# Patient Record
Sex: Female | Born: 1960 | Race: White | Hispanic: No | Marital: Married | Smoking: Never smoker
Health system: Southern US, Community
[De-identification: ages and names within clinical notes are randomized; demographics above are authoritative.]

## PROBLEM LIST (undated history)

## (undated) DIAGNOSIS — F410 Panic disorder [episodic paroxysmal anxiety] without agoraphobia: Secondary | ICD-10-CM

## (undated) DIAGNOSIS — R51 Headache: Secondary | ICD-10-CM

## (undated) DIAGNOSIS — K7689 Other specified diseases of liver: Secondary | ICD-10-CM

## (undated) DIAGNOSIS — G8929 Other chronic pain: Secondary | ICD-10-CM

## (undated) DIAGNOSIS — F419 Anxiety disorder, unspecified: Secondary | ICD-10-CM

## (undated) HISTORY — DX: Other chronic pain: G89.29

## (undated) HISTORY — DX: Panic disorder (episodic paroxysmal anxiety): F41.0

## (undated) HISTORY — DX: Anxiety disorder, unspecified: F41.9

## (undated) HISTORY — DX: Other specified diseases of liver: K76.89

## (undated) HISTORY — DX: Headache: R51

---

## 2014-10-15 ENCOUNTER — Encounter: Payer: Self-pay | Admitting: Internal Medicine

## 2014-11-25 ENCOUNTER — Encounter: Payer: Self-pay | Admitting: *Deleted

## 2014-12-24 ENCOUNTER — Encounter: Payer: Self-pay | Admitting: Internal Medicine

## 2014-12-24 ENCOUNTER — Ambulatory Visit (INDEPENDENT_AMBULATORY_CARE_PROVIDER_SITE_OTHER): Payer: BLUE CROSS/BLUE SHIELD | Admitting: Internal Medicine

## 2014-12-24 ENCOUNTER — Other Ambulatory Visit (INDEPENDENT_AMBULATORY_CARE_PROVIDER_SITE_OTHER): Payer: BLUE CROSS/BLUE SHIELD

## 2014-12-24 VITALS — BP 100/60 | HR 72 | Ht 63.5 in | Wt 106.0 lb

## 2014-12-24 DIAGNOSIS — K7689 Other specified diseases of liver: Secondary | ICD-10-CM | POA: Diagnosis not present

## 2014-12-24 DIAGNOSIS — R748 Abnormal levels of other serum enzymes: Secondary | ICD-10-CM | POA: Diagnosis not present

## 2014-12-24 LAB — FERRITIN: FERRITIN: 38.7 ng/mL (ref 10.0–291.0)

## 2014-12-24 LAB — TSH: TSH: 2.57 u[IU]/mL (ref 0.35–4.50)

## 2014-12-24 LAB — CBC WITH DIFFERENTIAL/PLATELET
Basophils Absolute: 0 10*3/uL (ref 0.0–0.1)
Basophils Relative: 0.8 % (ref 0.0–3.0)
EOS PCT: 2.4 % (ref 0.0–5.0)
Eosinophils Absolute: 0.1 10*3/uL (ref 0.0–0.7)
HEMATOCRIT: 37.1 % (ref 36.0–46.0)
HEMOGLOBIN: 12.6 g/dL (ref 12.0–15.0)
LYMPHS ABS: 1.6 10*3/uL (ref 0.7–4.0)
Lymphocytes Relative: 38.1 % (ref 12.0–46.0)
MCHC: 33.9 g/dL (ref 30.0–36.0)
MCV: 87.4 fl (ref 78.0–100.0)
MONOS PCT: 12.7 % — AB (ref 3.0–12.0)
Monocytes Absolute: 0.5 10*3/uL (ref 0.1–1.0)
NEUTROS ABS: 1.9 10*3/uL (ref 1.4–7.7)
Neutrophils Relative %: 46 % (ref 43.0–77.0)
Platelets: 180 10*3/uL (ref 150.0–400.0)
RBC: 4.25 Mil/uL (ref 3.87–5.11)
RDW: 13.4 % (ref 11.5–15.5)
WBC: 4.1 10*3/uL (ref 4.0–10.5)

## 2014-12-24 LAB — IBC PANEL
Iron: 72 ug/dL (ref 42–145)
Saturation Ratios: 17.4 % — ABNORMAL LOW (ref 20.0–50.0)
TRANSFERRIN: 296 mg/dL (ref 212.0–360.0)

## 2014-12-24 LAB — BASIC METABOLIC PANEL
BUN: 24 mg/dL — ABNORMAL HIGH (ref 6–23)
CALCIUM: 9.2 mg/dL (ref 8.4–10.5)
CHLORIDE: 107 meq/L (ref 96–112)
CO2: 29 mEq/L (ref 19–32)
Creatinine, Ser: 0.81 mg/dL (ref 0.40–1.20)
GFR: 77.79 mL/min (ref >60.00)
Glucose, Bld: 92 mg/dL (ref 70–99)
Potassium: 4.1 mEq/L (ref 3.5–5.1)
Sodium: 140 mEq/L (ref 135–145)

## 2014-12-24 LAB — IGA: IgA: 128 mg/dL (ref 68–378)

## 2014-12-24 LAB — HEPATIC FUNCTION PANEL
ALT: 93 U/L — ABNORMAL HIGH (ref 0–35)
AST: 67 U/L — AB (ref 0–37)
Albumin: 4.3 g/dL (ref 3.5–5.2)
Alkaline Phosphatase: 58 U/L (ref 39–117)
BILIRUBIN TOTAL: 0.5 mg/dL (ref 0.2–1.2)
Bilirubin, Direct: 0.1 mg/dL (ref 0.0–0.3)
Total Protein: 6.7 g/dL (ref 6.0–8.3)

## 2014-12-24 LAB — GAMMA GT: GGT: 32 U/L (ref 7–51)

## 2014-12-24 NOTE — Patient Instructions (Signed)
   Your physician has requested that you go to the basement for the lab work before leaving today.  You have been scheduled for an MRI at [facility]  on 12/30/14. Your appointment time is 7:00pm. Please arrive 30 minutes prior to your appointment time for registration purposes. Please make certain not to have anything to eat or drink 6 hours prior to your test. In addition, if you have any metal in your body, have a pacemaker or defibrillator, please be sure to let your ordering physician know. This test typically takes 45 minutes to 1 hour to complete.  Take your Ativan with you but don't take it until the tech tells you.   I appreciate the opportunity to care for you.

## 2014-12-24 NOTE — Progress Notes (Signed)
Patient ID: Brandi Miranda, female   DOB: [birth year], 54 y.o.   MRN: [id number] HPI: Brandi Miranda is a 54 year old female with past medical history of elevated liver enzymes, history of liver cyst, headaches, and anxiety who seen in consultation at the request of Dr. Jonelle Sports to evaluate elevated liver enzymes. She's here today with her husband. They live in [place], [place]. She reports overall she feels well. She reports a good energy level. 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 She does alter sounds. She denies a change in appetite though she has never been a "big eater". No nausea or vomiting. No heartburn. No dysphagia or odynophagia. No abdominal pain. She reports normal bowel movements without constipation, diarrhea, blood in her stool or melena. She denies a history of jaundice, ascites, lower extremity edema. She does report itching which seems to come and go but she feels this is related to season and the use of lotion. She denies fever, chills and night sweats. She reports I always "stay cold". No prior surgeries other than a C-section. She no longer has menstrual periods.  She drinks alcohol infrequently less than daily and approximately 2-3 glasses of wine per week. She denies the use of over-the-counter medications. She takes aspirin may be one day per week. She is on Keppra for migraines. She takes Zyrtec 10 mg daily. She uses trazodone 50 mg at night for sleep. Very very rare Ativan use for anxiety.  Regarding her liver cyst. 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  She denies a family history of liver disease.  She had a colonoscopy which was reportedly normal with Dr. Aleene Davidson a few yrs ago  Past Medical History  Diagnosis Date  . Liver cyst   . Panic attack   . Anxiety   . Chronic headaches     On left side    Past  Surgical History  Procedure Laterality Date  . Cesarean section      No outpatient prescriptions prior to visit.   No facility-administered medications prior to visit.    No Known Allergies  Family History  Problem Relation Age of Onset  . Colon cancer Neg Hx   . Colon polyps Neg Hx   . Diabetes Mother   . Heart disease Mother   . Esophageal cancer Neg Hx   . Kidney disease Neg Hx   . Gallbladder disease Neg Hx     History  Substance Use Topics  . Smoking status: Never Smoker   . Smokeless tobacco: Never Used  . Alcohol Use: 0.0 oz/week    0 Standard drinks or equivalent per week     Comment: 2-3 glasses of wine a week    ROS: As per history of present illness, otherwise negative  BP 100/60 mmHg  Pulse 72  Ht 5' 3.5" (1.613 m)  Wt 106 lb (48.081 kg)  BMI 18.48 kg/m2 Constitutional: Well-developed and well-nourished. No distress. HEENT: Normocephalic and atraumatic. Oropharynx is clear and moist. No oropharyngeal exudate. Conjunctivae are normal.  No scleral icterus. Neck: Neck supple. Trachea midline. Cardiovascular: Normal rate, regular rhythm and intact distal pulses. No M/R/G Pulmonary/chest: Effort normal and breath sounds normal. No wheezing, rales or rhonchi. Abdominal: Soft, thin, nontender, nondistended. Bowel sounds active throughout. There are no masses palpable. No hepatosplenomegaly. Extremities: no clubbing, cyanosis, or edema Lymphadenopathy: No cervical  adenopathy noted. Neurological: Alert and oriented to person place and time. Skin: Skin is warm and dry. No rashes noted. Psychiatric: Normal mood and affect. Behavior is normal.  RELEVANT LABS AND IMAGING: CBC    Component Value Date/Time   WBC 4.1 12/24/2014 0947   RBC 4.25 12/24/2014 0947   HGB 12.6 12/24/2014 0947   HCT 37.1 12/24/2014 0947   PLT 180.0 12/24/2014 0947   MCV 87.4 12/24/2014 0947   MCHC 33.9 12/24/2014 0947   RDW 13.4 12/24/2014 0947   LYMPHSABS 1.6 12/24/2014 0947    MONOABS 0.5 12/24/2014 0947   EOSABS 0.1 12/24/2014 0947   BASOSABS 0.0 12/24/2014 0947    CMP     Component Value Date/Time   NA 140 12/24/2014 0947   K 4.1 12/24/2014 0947   CL 107 12/24/2014 0947   CO2 29 12/24/2014 0947   GLUCOSE 92 12/24/2014 0947   BUN 24* 12/24/2014 0947   CREATININE 0.81 12/24/2014 0947   CALCIUM 9.2 12/24/2014 0947   PROT 6.7 12/24/2014 0947   ALBUMIN 4.3 12/24/2014 0947   AST 67* 12/24/2014 0947   ALT 93* 12/24/2014 0947   ALKPHOS 58 12/24/2014 0947   BILITOT 0.5 12/24/2014 0947   Labs reviewed dated February 2016 -- AST 73, ALT 105, total bili 0.6, alkaline phosphatase 51, hemoglobin 12.5, platelet 200, hepatitis B surface antigen negative, core antibody total nonreactive, hep C antibody negative Labs dated September 2014 -- AST 226, ALT 376, total bili 0.7, alkaline phosphatase 60  Colonoscopy 08/29/2012 -- normal colonoscopy. Repeat in 10 years recommended,  exam to the cecum.  Abdominal ultrasound 03/19/2013 -- liver normal in size and echogenicity. Hypoechoic lesion in anterior aspect of the right lobe of the liver measuring 2.3 x 1.7 cm. The lesion has well-defined walls, no solid elements in good through transmission. No abnormal color flow. Normal portal venous flow to the liver itself. Gallbladder normal. Bile ducts normal with CBD less than 2 mm.   ASSESSMENT/PLAN: 54 year old female with past medical history of elevated liver enzymes, history of liver cyst, headaches, and anxiety who seen in consultation at the request of Dr. Jonelle Sports to evaluate elevated liver enzymes  1. Elevated serum transaminases -- persistent elevation in serum AST and ALT at least over the last 2 years. No evidence for chronic/decompensated liver disease or cirrhosis. We discussed the differential for elevated liver enzymes. I would like to determine etiology so that treatment can be started if necessary. We discussed how chronic liver inflammation can eventually lead to  scarring and even cirrhosis. Viral hepatitis studies negative this year. I recommended further lab testing to exclude autoimmune hepatitis and other auto immune liver conditions. Also will check iron studies, ceruloplasmin, alpha-1 antitrypsin, celiac panel and thyroid profile. Drug-induced liver inflammation was discussed though at this time there are no major offenders on her medication list. We discussed the possibility of biopsy if necessary but at this time we'll start with further lab testing. We'll also recommend cross-sectional imaging, see #2   2. Liver cyst -- likely benign with low risk features. Evaluate further with MRI abdomen, hepatic protocol  3. Colorectal cancer screening -- up-to-date, repeat colonoscopy recommended September 2024    Cc: Dr. Reuel Boom Pomposini

## 2014-12-25 ENCOUNTER — Telehealth: Payer: Self-pay | Admitting: Internal Medicine

## 2014-12-25 LAB — TISSUE TRANSGLUTAMINASE, IGA: Tissue Transglutaminase Ab, IgA: 1 U/mL (ref <4)

## 2014-12-25 LAB — GLIADIN ANTIBODIES, SERUM
Gliadin IgA: 4 Units (ref <20)
Gliadin IgG: 2 Units (ref <20)

## 2014-12-25 LAB — ANA: ANA: NEGATIVE

## 2014-12-25 NOTE — Telephone Encounter (Signed)
Had faxed order yesterday to her.  She thinks someone threw it away.  Re-faxed PT/INR order today.

## 2014-12-26 LAB — MITOCHONDRIAL ANTIBODIES: Mitochondrial M2 Ab, IgG: 0.3 (ref <0.91)

## 2014-12-26 LAB — RETICULIN ANTIBODIES, IGA W TITER: Reticulin Ab, IgA: NEGATIVE

## 2014-12-26 LAB — CERULOPLASMIN: Ceruloplasmin: 27 mg/dL (ref 18–53)

## 2014-12-26 LAB — ALPHA-1-ANTITRYPSIN: A1 ANTITRYPSIN SER: 166 mg/dL (ref 83–199)

## 2014-12-26 LAB — ANTI-SMOOTH MUSCLE ANTIBODY, IGG: Smooth Muscle Ab: 10 U (ref <20)

## 2014-12-30 ENCOUNTER — Ambulatory Visit (HOSPITAL_COMMUNITY)
Admission: RE | Admit: 2014-12-30 | Discharge: 2014-12-30 | Disposition: A | Discharge status: Home / Self Care | Payer: BLUE CROSS/BLUE SHIELD | Source: Ambulatory Visit | Attending: Internal Medicine | Admitting: Internal Medicine

## 2014-12-30 DIAGNOSIS — N281 Cyst of kidney, acquired: Secondary | ICD-10-CM | POA: Insufficient documentation

## 2014-12-30 DIAGNOSIS — K7689 Other specified diseases of liver: Secondary | ICD-10-CM | POA: Diagnosis not present

## 2014-12-30 DIAGNOSIS — R748 Abnormal levels of other serum enzymes: Secondary | ICD-10-CM

## 2014-12-30 MED ORDER — GADOBENATE DIMEGLUMINE 529 MG/ML IV SOLN
10.0000 mL | Freq: Once | INTRAVENOUS | Status: AC | PRN
  Administered 2014-12-30: 10 mL via INTRAVENOUS

## 2015-01-01 ENCOUNTER — Telehealth: Payer: Self-pay | Admitting: Internal Medicine

## 2015-01-01 ENCOUNTER — Other Ambulatory Visit: Payer: Self-pay

## 2015-01-01 DIAGNOSIS — R945 Abnormal results of liver function studies: Principal | ICD-10-CM

## 2015-01-01 DIAGNOSIS — R7989 Other specified abnormal findings of blood chemistry: Secondary | ICD-10-CM

## 2015-01-01 MED ORDER — TEMAZEPAM 15 MG PO CAPS: 15.0000 mg | ORAL_CAPSULE | Freq: Every evening | ORAL | Status: AC | PRN

## 2015-01-01 NOTE — Telephone Encounter (Signed)
See result note.  

## 2015-03-23 ENCOUNTER — Telehealth: Payer: Self-pay

## 2015-03-23 NOTE — Telephone Encounter (Signed)
-----   Message from Chrystie Nose, RN sent at 01/01/2015  8:39 AM EDT ----- Regarding: Labs Lfts in Sept. Order in

## 2015-03-23 NOTE — Telephone Encounter (Signed)
Left message for pt to call back  °

## 2015-03-24 NOTE — Telephone Encounter (Signed)
Unable to reach pt by phone. Letter mailed to pt regarding having labs drawn.

## 2015-05-15 ENCOUNTER — Telehealth: Payer: Self-pay | Admitting: Internal Medicine

## 2015-05-15 NOTE — Telephone Encounter (Signed)
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 She will call us on Monday with the fax number to send the order for LFT. She will fax us the results when she has it done.

## 2015-05-18 NOTE — Telephone Encounter (Signed)
Fax: [phone] Attn: Elnita Maxwellheryl

## 2015-05-18 NOTE — Telephone Encounter (Signed)
Faxed LFT order to fax number that was provided. Also placed our fax information on the cover sheet.

## 2015-05-19 ENCOUNTER — Telehealth: Payer: Self-pay | Admitting: Internal Medicine

## 2015-05-19 NOTE — Telephone Encounter (Signed)
Order refaxed per pt request.

## 2015-05-19 NOTE — Telephone Encounter (Signed)
Order faxed.

## 2015-05-25 ENCOUNTER — Telehealth: Payer: Self-pay | Admitting: Internal Medicine

## 2015-05-25 NOTE — Telephone Encounter (Signed)
I have not seen any labs. I looked in Dr Lauro FranklinPyrtle's office as well and they are not in there.

## 2015-05-25 NOTE — Telephone Encounter (Signed)
Have you seen any labs that were supposedly faxed on this pt?

## 2015-05-25 NOTE — Telephone Encounter (Signed)
Discussed with pt that we have not seen the labs. States she will refax them to [phone].

## 2015-08-24 ENCOUNTER — Telehealth: Payer: Self-pay | Admitting: *Deleted

## 2015-08-24 DIAGNOSIS — R945 Abnormal results of liver function studies: Principal | ICD-10-CM

## 2015-08-24 DIAGNOSIS — R7989 Other specified abnormal findings of blood chemistry: Secondary | ICD-10-CM

## 2015-08-24 NOTE — Telephone Encounter (Signed)
-----   Message from Richardson Chiquito, [place] sent at 08/17/2015  9:35 AM EST -----   ----- Message -----    From: Richardson Chiquito, CMA    Sent: 08/17/2015      To: Richardson Chiquito, CMA  Needs lfts around 2-29-17... Send orders to pt work (see previous telephone notes).

## 2015-08-24 NOTE — Telephone Encounter (Signed)
I have left a voicemail for patient to call back. Orders have been created and will be faxed to patient's work when she gives the go ahead.

## 2015-08-26 NOTE — Telephone Encounter (Signed)
I have spoken to patient and have faxed orders to her work per her request.

## 2015-09-14 NOTE — Telephone Encounter (Signed)
Left message for patient to call back  

## 2015-09-16 ENCOUNTER — Telehealth: Payer: Self-pay | Admitting: *Deleted

## 2015-09-16 NOTE — Telephone Encounter (Signed)
I have left another message for patient to call back. 

## 2015-09-16 NOTE — Telephone Encounter (Signed)
-----   Message from Richardson Chiquitoorothy N Kvon Mcilhenny, CMA sent at 09/14/2015  8:45 AM EDT ----- Left message for patient to call back. Did she?  ----- Message -----    From: Richardson Chiquitoorothy N Marston Mccadden, CMA    Sent: 09/11/2015      To: Richardson Chiquitoorothy N Kamarian Sahakian, CMA  Did patient have lft's at her work? See 2/29/17 note

## 2015-09-18 NOTE — Telephone Encounter (Signed)
Patient's labs have been sent over for Dr Pyrtle's review.

## 2015-09-21 ENCOUNTER — Telehealth: Payer: Self-pay | Admitting: Internal Medicine

## 2015-09-21 NOTE — Telephone Encounter (Signed)
Labs have been received and placed on Dr Lauro FranklinPyrtle's desk for review.

## 2015-09-24 NOTE — Telephone Encounter (Signed)
Dr. Rhea BeltonPyrtle has reviewed the labs and states LFT"s have normalized off trazodone. I have added Trazodone to allergy list for patient and per Dr. Rhea BeltonPyrtle patient needs repeat LFT's in 6 months. Reminder for patient put in our system. Left a message for patient to return my call to discuss.

## 2015-09-25 NOTE — Telephone Encounter (Signed)
Left a message for patient to return my call. 

## 2015-09-28 NOTE — Telephone Encounter (Signed)
Informed patient of results and they we will contact her in 6 months to remind her to repeat her LFT's. Patient verbalized understanding.

## 2016-03-28 ENCOUNTER — Telehealth: Payer: Self-pay | Admitting: *Deleted

## 2016-03-28 NOTE — Telephone Encounter (Signed)
I have left a voicemail for patient to call back. 

## 2016-03-28 NOTE — Telephone Encounter (Signed)
-----   Message from Jessee AversAmanda L Lewellyn, CMA sent at 09/24/2015 11:31 AM EDT ----- Per Dr. Rhea BeltonPyrtle from phone note on 09/21/15, she needs repeat LFT's 6 months from labs drawn [facility] 09/[facility]/15 [facility]

## 2016-03-29 NOTE — Telephone Encounter (Signed)
Left message with female for patient to call back. 

## 2016-03-30 NOTE — Telephone Encounter (Signed)
Patient returning your call can call back to [phone] Ask for her at work- [employer] [facility].

## 2016-03-30 NOTE — Telephone Encounter (Signed)
We have received lab results from patient. These have been placed on Dr Lauro FranklinPyrtle's desk for review.

## 2016-04-11 ENCOUNTER — Telehealth: Payer: Self-pay | Admitting: Internal Medicine

## 2016-04-11 NOTE — Telephone Encounter (Signed)
Patient advised that per Dr Rhea BeltonPyrtle, her most recent labs suffice for his requested follow up and that she can follow up with her PCP and see us as needed. She verbalizes understanding.

## 2023-03-21 ENCOUNTER — Encounter: Payer: Self-pay | Admitting: Internal Medicine

## 2023-05-12 ENCOUNTER — Ambulatory Visit (AMBULATORY_SURGERY_CENTER): Payer: BC Managed Care – PPO

## 2023-05-12 VITALS — Ht 64.0 in | Wt 113.0 lb

## 2023-05-12 DIAGNOSIS — Z1211 Encounter for screening for malignant neoplasm of colon: Secondary | ICD-10-CM

## 2023-05-12 MED ORDER — NA SULFATE-K SULFATE-MG SULF 17.5-3.13-1.6 GM/177ML PO SOLN: 1.0000 | Freq: Once | ORAL | 0 refills | Status: AC

## 2023-05-12 NOTE — Progress Notes (Signed)
No egg or soy allergy known to patient  No issues known to pt with past sedation with any surgeries or procedures Patient denies ever being told they had issues or difficulty with intubation  No FH of Malignant Hyperthermia Pt is not on diet pills Pt is not on  home 02  Pt is not on blood thinners  Pt reports constipation uses OTC stool softner as needed  No A fib or A flutter Have any cardiac testing pending--no  LOA: independent  Prep: suprep   Patient's chart reviewed by Cathlyn Parsons CNRA prior to previsit and patient appropriate for the [facility].  Previsit completed and red dot placed by patient's name on their procedure day (on provider's schedule).     PV competed with patient. Prep instructions sent via mychart and home address.

## 2023-05-30 ENCOUNTER — Encounter: Payer: BLUE CROSS/BLUE SHIELD | Admitting: Internal Medicine

## 2023-07-25 ENCOUNTER — Encounter: Payer: Self-pay | Admitting: Internal Medicine

## 2023-07-28 ENCOUNTER — Ambulatory Visit (AMBULATORY_SURGERY_CENTER): Payer: BC Managed Care – PPO | Admitting: Internal Medicine

## 2023-07-28 ENCOUNTER — Encounter: Payer: Self-pay | Admitting: Internal Medicine

## 2023-07-28 VITALS — BP 125/70 | HR 69 | Temp 97.9°F | Resp 13 | Ht 63.0 in | Wt 113.0 lb

## 2023-07-28 DIAGNOSIS — Z1211 Encounter for screening for malignant neoplasm of colon: Secondary | ICD-10-CM | POA: Diagnosis present

## 2023-07-28 DIAGNOSIS — D123 Benign neoplasm of transverse colon: Secondary | ICD-10-CM

## 2023-07-28 DIAGNOSIS — K5730 Diverticulosis of large intestine without perforation or abscess without bleeding: Secondary | ICD-10-CM | POA: Diagnosis not present

## 2023-07-28 MED ORDER — SODIUM CHLORIDE 0.9 % IV SOLN: 500.0000 mL | Freq: Once | INTRAVENOUS | Status: DC

## 2023-07-28 NOTE — Patient Instructions (Signed)
Resume previous diet and medications. Awaiting pathology results. Repeat Colonoscopy date to be determined based on pathology results. Handouts provided on Colon polyps and diverticulosis  YOU HAD AN ENDOSCOPIC PROCEDURE TODAY AT THE [facility]:   Refer to the procedure report that was given to you for any specific questions about what was found during the examination.  If the procedure report does not answer your questions, please call your gastroenterologist to clarify.  If you requested that your care partner not be given the details of your procedure findings, then the procedure report has been included in a sealed envelope for you to review at your convenience later.  YOU SHOULD EXPECT: Some feelings of bloating in the abdomen. Passage of more gas than usual.  Walking can help get rid of the air that was put into your GI tract during the procedure and reduce the bloating. If you had a lower endoscopy (such as a colonoscopy or flexible sigmoidoscopy) you may notice spotting of blood in your stool or on the toilet paper. If you underwent a bowel prep for your procedure, you may not have a normal bowel movement for a few days.  Please Note:  You might notice some irritation and congestion in your nose or some drainage.  This is from the oxygen used during your procedure.  There is no need for concern and it should clear up in a day or so.  SYMPTOMS TO REPORT IMMEDIATELY:  Following lower endoscopy (colonoscopy or flexible sigmoidoscopy):  Excessive amounts of blood in the stool  Significant tenderness or worsening of abdominal pains  Swelling of the abdomen that is new, acute  Fever of 100F or higher  For urgent or emergent issues, a gastroenterologist can be reached at any hour by calling [phone]. Do not use MyChart messaging for urgent concerns.    DIET:  We do recommend a small meal at first, but then you may proceed to your regular diet.  Drink plenty of fluids but  you should avoid alcoholic beverages for 24 hours.  ACTIVITY:  You should plan to take it easy for the rest of today and you should NOT DRIVE or use heavy machinery until tomorrow (because of the sedation medicines used during the test).    FOLLOW UP: Our staff will call the number listed on your records the next business day following your procedure.  We will call around 7:15- 8:00 am to check on you and address any questions or concerns that you may have regarding the information given to you following your procedure. If we do not reach you, we will leave a message.     If any biopsies were taken you will be contacted by phone or by letter within the next 1-3 weeks.  Please call us at [phone] if you have not heard about the biopsies in 3 weeks.    SIGNATURES/CONFIDENTIALITY: You and/or your care partner have signed paperwork which will be entered into your electronic medical record.  These signatures attest to the fact that that the information above on your After Visit Summary has been reviewed and is understood.  Full responsibility of the confidentiality of this discharge information lies with you and/or your care-partner.

## 2023-07-28 NOTE — Progress Notes (Signed)
Pt's states no medical or surgical changes since previsit or office visit.

## 2023-07-28 NOTE — Progress Notes (Signed)
Called to room to assist during endoscopic procedure.  Patient ID and intended procedure confirmed with present staff. Received instructions for my participation in the procedure from the performing physician.

## 2023-07-28 NOTE — Op Note (Signed)
[facility] Patient Name: Tanisha Lutes Procedure Date: 07/28/2023 1:35 PM MRN: [id number] Endoscopist: Beverley Fiedler , MD, [id number] Age: 63 Referring MD:  Date of Birth: [birth year] Gender: Female Account #: [id number] Procedure:                Colonoscopy Indications:              Screening for colorectal malignant neoplasm, Last                            colonoscopy 10 years ago Medicines:                Monitored Anesthesia Care Procedure:                Pre-Anesthesia Assessment:                           - Prior to the procedure, a History and Physical                            was performed, and patient medications and                            allergies were reviewed. The patient's tolerance of                            previous anesthesia was also reviewed. The risks                            and benefits of the procedure and the sedation                            options and risks were discussed with the patient.                            All questions were answered, and informed consent                            was obtained. Prior Anticoagulants: The patient has                            taken no anticoagulant or antiplatelet agents. ASA                            Grade Assessment: II - A patient with mild systemic                            disease. After reviewing the risks and benefits,                            the patient was deemed in satisfactory condition to                            undergo the procedure.  After obtaining informed consent, the colonoscope                            was passed under direct vision. Throughout the                            procedure, the patient's blood pressure, pulse, and                            oxygen saturations were monitored continuously. The                            [id number]-HQ190L Colonoscope [id number] was introduced                            through the anus and advanced to the  cecum,                            identified by appendiceal orifice and ileocecal                            valve. The colonoscopy was performed without                            difficulty. The patient tolerated the procedure                            well. The quality of the bowel preparation was                            excellent. The ileocecal valve, appendiceal                            orifice, and rectum were photographed. Scope In: 1:37:56 PM Scope Out: 1:48:40 PM Scope Withdrawal Time: 0 hours 7 minutes 10 seconds  Total Procedure Duration: 0 hours 10 minutes 44 seconds  Findings:                 The digital rectal exam was normal.                           A 5 mm polyp was found in the transverse colon. The                            polyp was sessile. The polyp was removed with a                            cold snare. Resection and retrieval were complete.                           A few small-mouthed diverticula were found in the                            sigmoid colon.  The exam was otherwise without abnormality on                            direct and retroflexion views. Complications:            No immediate complications. Estimated Blood Loss:     Estimated blood loss: none. Impression:               - One 5 mm polyp in the transverse colon, removed                            with a cold snare. Resected and retrieved.                           - Mild diverticulosis in the sigmoid colon.                           - The examination was otherwise normal on direct                            and retroflexion views. Recommendation:           - Patient has a contact number available for                            emergencies. The signs and symptoms of potential                            delayed complications were discussed with the                            patient. Return to normal activities tomorrow.                            Written  discharge instructions were provided to the                            patient.                           - Resume previous diet.                           - Continue present medications.                           - Await pathology results.                           - Repeat colonoscopy is recommended. The                            colonoscopy date will be determined after pathology                            results from today's exam become available for  review. Beverley Fiedler, MD 07/28/2023 1:51:41 PM This report has been signed electronically.

## 2023-07-28 NOTE — Progress Notes (Signed)
Report to PACU, RN, vss, BBS= Clear.

## 2023-07-28 NOTE — Progress Notes (Signed)
GASTROENTEROLOGY PROCEDURE H&P NOTE   Primary Care Physician: Pomposini, Rande Brunt, MD    Reason for Procedure:  Colon cancer screening  Plan:    Colonoscopy  Patient is appropriate for endoscopic procedure(s) in the ambulatory ([facility]) setting.  The nature of the procedure, as well as the risks, benefits, and alternatives were carefully and thoroughly reviewed with the patient. Ample time for discussion and questions allowed. The patient understood, was satisfied, and agreed to proceed.     HPI: Brandi Miranda is a 63 y.o. female who presents for colonoscopy.  Medical history as below.  Tolerated the prep.  No recent chest pain or shortness of breath.  No abdominal pain today.  Past Medical History:  Diagnosis Date   Anxiety    Chronic headaches    On left side   Liver cyst    Panic attack     Past Surgical History:  Procedure Laterality Date   CESAREAN SECTION      Prior to Admission medications   Medication Sig Start Date End Date Taking? Authorizing Provider  Eszopiclone 3 MG TABS Take 3 mg by mouth at bedtime. Lunesta 04/25/23  Yes [provider]  aspirin 81 MG tablet Take 81 mg by mouth as needed for pain. Patient not taking: Reported on 07/28/2023    [provider]  busPIRone (BUSPAR) 5 MG tablet Take 5 mg by mouth 3 (three) times daily as needed. 06/05/23   [provider]  cetirizine (ZYRTEC) 10 MG tablet Take 10 mg by mouth daily. Patient not taking: Reported on 07/28/2023    [provider]  levETIRAcetam (KEPPRA) 500 MG tablet Take 500 mg by mouth daily. Patient not taking: Reported on 07/28/2023    [provider]  lisinopril-hydrochlorothiazide (ZESTORETIC) 20-12.5 MG tablet Take 1 tablet by mouth daily. Patient not taking: Reported on 07/28/2023 07/08/22   [provider]  LORazepam (ATIVAN) 1 MG tablet Take 1 mg by mouth as needed for anxiety. Patient not taking: Reported on 07/28/2023    [provider]  temazepam (RESTORIL) 15 MG capsule Take 1 capsule (15 mg total) by mouth at bedtime as needed for sleep. Patient not taking: Reported on 07/28/2023 01/01/15   Beverley Fiedler, MD  traZODone (DESYREL) 100 MG tablet Take 100 mg by mouth at bedtime. Pt takes 1/2 tablet every evening Patient not taking: Reported on 07/28/2023    [provider]    Current Outpatient Medications  Medication Sig Dispense Refill   Eszopiclone 3 MG TABS Take 3 mg by mouth at bedtime. Lunesta     aspirin 81 MG tablet Take 81 mg by mouth as needed for pain. (Patient not taking: Reported on 07/28/2023)     busPIRone (BUSPAR) 5 MG tablet Take 5 mg by mouth 3 (three) times daily as needed.     cetirizine (ZYRTEC) 10 MG tablet Take 10 mg by mouth daily. (Patient not taking: Reported on 07/28/2023)     levETIRAcetam (KEPPRA) 500 MG tablet Take 500 mg by mouth daily. (Patient not taking: Reported on 07/28/2023)     lisinopril-hydrochlorothiazide (ZESTORETIC) 20-12.5 MG tablet Take 1 tablet by mouth daily. (Patient not taking: Reported on 07/28/2023)     LORazepam (ATIVAN) 1 MG tablet Take 1 mg by mouth as needed for anxiety. (Patient not taking: Reported on 07/28/2023)     temazepam (RESTORIL) 15 MG capsule Take 1 capsule (15 mg total) by mouth at bedtime as needed for sleep. (Patient not taking: Reported on 07/28/2023)  30 capsule 3   traZODone (DESYREL) 100 MG tablet Take 100 mg by mouth at bedtime. Pt takes 1/2 tablet every evening (Patient not taking: Reported on 07/28/2023)     Current Facility-Administered Medications  Medication Dose Route Frequency Provider Last Rate Last Admin   0.9 %  sodium chloride infusion  500 mL Intravenous Once Farley Crooker, Carie Caddy, MD        Allergies as of 07/28/2023 - Review Complete 07/28/2023  Allergen Reaction Noted   Trazodone and nefazodone  09/24/2015    Family History  Problem Relation Age of Onset   Diabetes Mother    Heart disease Mother    Colon cancer Neg Hx     Colon polyps Neg Hx    Esophageal cancer Neg Hx    Kidney disease Neg Hx    Gallbladder disease Neg Hx    Rectal cancer Neg Hx    Stomach cancer Neg Hx     Social History   Socioeconomic History   Marital status: Married    Spouse name: Not on file   Number of children: 1   Years of education: Not on file   Highest education level: Not on file  Occupational History   Occupation: [employer], [employer]  Tobacco Use   Smoking status: Never   Smokeless tobacco: Never  Vaping Use   Vaping status: Never Used  Substance and Sexual Activity   Alcohol use: Yes    Alcohol/week: 0.0 standard drinks of alcohol    Comment: 2-3 glasses of wine a week   Drug use: No   Sexual activity: Not on file  Other Topics Concern   Not on file  Social History Narrative   Not on file   Social Drivers of Health   Financial Resource Strain: Not on file  Food Insecurity: Not on file  Transportation Needs: Not on file  Physical Activity: Not on file  Stress: Not on file  Social Connections: Not on file  Intimate Partner Violence: Not on file    Physical Exam: Vital signs in last 24 hours: @BP  (!) 166/75   Pulse 83   Temp 97.9 F (36.6 C) (Temporal)   Resp 15   Ht 5\' 3"  (1.6 m)   Wt 113 lb (51.3 kg)   SpO2 98%   BMI 20.02 kg/m  GEN: NAD EYE: Sclerae anicteric ENT: MMM CV: Non-tachycardic Pulm: CTA b/l GI: Soft, NT/ND NEURO:  Alert & Oriented x 3   Erick Blinks, MD [facility]  07/28/2023 1:33 PM

## 2023-07-31 ENCOUNTER — Telehealth: Payer: Self-pay

## 2023-07-31 NOTE — Telephone Encounter (Signed)
  Follow up Call-     07/28/2023   12:42 PM  Call back number  Post procedure Call Back phone  # [phone]  Permission to leave phone message Yes     Patient questions:  Do you have a fever, pain , or abdominal swelling? No. Pain Score  0 *  Have you tolerated food without any problems? Yes.    Have you been able to return to your normal activities? Yes.    Do you have any questions about your discharge instructions: Diet   No. Medications  No. Follow up visit  No.  Do you have questions or concerns about your Care? No.  Actions: * If pain score is 4 or above: No action needed, pain <4.

## 2023-08-02 LAB — SURGICAL PATHOLOGY

## 2023-08-03 ENCOUNTER — Encounter: Payer: Self-pay | Admitting: Internal Medicine
